# Patient Record
Sex: Male | Born: 1965 | Race: White | Hispanic: No | Smoking: Never smoker
Health system: Southern US, Community
[De-identification: ages and names within clinical notes are randomized; demographics above are authoritative.]

## PROBLEM LIST (undated history)

## (undated) ENCOUNTER — Emergency Department: Payer: Self-pay

## (undated) DIAGNOSIS — C801 Malignant (primary) neoplasm, unspecified: Secondary | ICD-10-CM

## (undated) DIAGNOSIS — Z789 Other specified health status: Secondary | ICD-10-CM

## (undated) HISTORY — PX: HERNIA REPAIR: SHX51

## (undated) HISTORY — PX: LASIK: SHX215

---

## 1998-09-27 ENCOUNTER — Emergency Department (HOSPITAL_COMMUNITY): Admission: EM | Admit: 1998-09-27 | Discharge: 1998-09-27 | Payer: Self-pay | Admitting: Emergency Medicine

## 1998-09-27 ENCOUNTER — Encounter: Payer: Self-pay | Admitting: Emergency Medicine

## 1998-09-30 ENCOUNTER — Encounter: Admission: RE | Admit: 1998-09-30 | Discharge: 1998-12-29 | Payer: Self-pay | Admitting: Internal Medicine

## 1998-10-04 ENCOUNTER — Encounter: Admission: RE | Admit: 1998-10-04 | Discharge: 1998-10-04 | Payer: Self-pay | Admitting: *Deleted

## 2006-01-10 ENCOUNTER — Emergency Department (HOSPITAL_COMMUNITY): Admission: EM | Admit: 2006-01-10 | Discharge: 2006-01-10 | Payer: Self-pay

## 2016-07-05 ENCOUNTER — Other Ambulatory Visit: Payer: Self-pay | Admitting: Otolaryngology

## 2016-07-06 ENCOUNTER — Other Ambulatory Visit: Payer: Self-pay | Admitting: Otolaryngology

## 2016-07-06 DIAGNOSIS — K118 Other diseases of salivary glands: Secondary | ICD-10-CM

## 2016-07-13 ENCOUNTER — Ambulatory Visit
Admission: RE | Admit: 2016-07-13 | Discharge: 2016-07-13 | Disposition: A | Discharge status: Home / Self Care | Payer: BLUE CROSS/BLUE SHIELD | Source: Ambulatory Visit | Attending: Otolaryngology | Admitting: Otolaryngology

## 2016-07-13 DIAGNOSIS — K118 Other diseases of salivary glands: Secondary | ICD-10-CM

## 2016-07-13 MED ORDER — IOPAMIDOL (ISOVUE-300) INJECTION 61%
75.0000 mL | Freq: Once | INTRAVENOUS | Status: AC | PRN
  Administered 2016-07-13: 75 mL via INTRAVENOUS

## 2016-08-17 ENCOUNTER — Ambulatory Visit: Payer: Self-pay | Admitting: Otolaryngology

## 2016-08-17 NOTE — H&P (Signed)
Charles Vazquez is a 50 y.o. male who presents as a new  Patient.   Referring Provider:    Chief complaint:  Cheek mass   HPI: He noticed sudden onset of a mildly tender right-sided cheek mass. Prior to that, he had a very small palpable nodule in that area for about a year. He was treated recently with antibiotics for this. Some of the surrounding pain and redness has gone down. It does seem to hurt when he eats anything. He drinks up to 40 ounces of Mountain Dew each day. He doesn't smoke or drink alcohol. Otherwise in good health.    PMH/Meds/All/SocHx/FamHx/ROS:    Past Medical History       Past Medical History:  Diagnosis Date  . Eczema        Past Surgical History        Past Surgical History:  Procedure Laterality Date  . HERNIA REPAIR        No family history of bleeding disorders, wound healing problems or difficulty with anesthesia.    Social History   Social History        Social History  . Marital status: Unknown    Spouse name: N/A  . Number of children: N/A  . Years of education: N/A   Occupational History  . Not on file.       Social History Main Topics  . Smoking status: Never Smoker  . Smokeless tobacco: Not on file  . Alcohol use No  . Drug use: Not on file  . Sexual activity: Not on file       Other Topics Concern  . Not on file   Social History Narrative       Current Outpatient Prescriptions:  .  amoxicillin-clavulanate (AUGMENTIN) 875-125 mg per tablet, TK 1 T PO BID, Disp: , Rfl: 0 .  traMADol (ULTRAM) 50 mg tablet, TK 1 T PO QID PRN, Disp: , Rfl: 0  Current Facility-Administered Medications:  .  measles, mumps and rubella vaccine (MMR) 1,000-12,500 TCID50/0.5 mL injection 0.5 mL, 0.5 mL, Subcutaneous, Once, Robin Schroeder [place], MD  A complete ROS was performed with pertinent positives/negatives noted in the HPI. The remainder of the ROS are negative.   Physical Exam:    BP 144/93  (Site: Left arm, Position: Sitting)  Ht 1.778 m (5' 10")  Wt 108.9 kg (240 lb)  BMI 34.44 kg/m2   General:  Healthy and alert, in no distress, breathing easily. Normal affect. In a pleasant mood. Head: Normocephalic, atraumatic. No masses, or scars. Eyes: Pupils are equal, and reactive to light. Vision is grossly intact. No spontaneous or gaze nystagmus. Ears: Ear canals are clear. Tympanic membranes are intact, with normal landmarks and the middle ears are clear and healthy. Hearing: Grossly normal. Nose: Nasal cavities are clear with healthy mucosa, no polyps or exudate.Airways are patent. Face: 4 cm firm slightly tender mass involving the right mid parotid gland. Facial nerve function is symmetric. Oral Cavity: No mucosal abnormalities are noted. Tongue with normal mobility. Dentition appears healthy. Oropharynx: Tonsils are symmetric. There are no mucosal masses identified. Tongue base appears normal and healthy. Larynx/Hypopharynx: deferred Chest: Deferred Neck: No palpable masses, no cervical adenopathy, no thyroid nodules or enlargement. Neuro: Cranial nerves II-XII will normal function. Balance: Normal gate. Other findings: none.   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 Recommend CT evaluation. We will   discuss further workup after that. Recommend he increase his water intake and decrease his caffeine intake. Marland Kitchen

## 2016-09-04 NOTE — Pre-Procedure Instructions (Signed)
Charles Vazquez  09/04/2016      [facility] [place], [address] [address] Phone: [phone] Fax: [phone]    Your procedure is scheduled on Friday, September 15.  Report to [facility] Admitting at 7:00 A.M.  Call this number if you have problems the morning of surgery:  [phone]   Remember:  Do not eat food or drink liquids after midnight.  Take these medicines the morning of surgery with A SIP OF WATER: NONE  7 days prior to surgery STOP taking any Aspirin, Aleve, Naproxen, Ibuprofen, Motrin, Advil, Goody's, BC's, all herbal medications, fish oil, and all vitamins    Do not wear jewelry, make-up or nail polish.  Do not wear lotions, powders, or perfumes, or deoderant.  Do not shave 48 hours prior to surgery.  Men may shave face and neck.  Do not bring valuables to the hospital.  [facility] is not responsible for any belongings or valuables.  Contacts, dentures or bridgework may not be worn into surgery.  Leave your suitcase in the car.  After surgery it may be brought to your room.  For patients admitted to the hospital, discharge time will be determined by your treatment team.  Patients discharged the day of surgery will not be allowed to drive home.    Special instructions:     [place]- Preparing For Surgery  Before surgery, you can play an important role. Because skin is not sterile, your skin needs to be as free of germs as possible. You can reduce the number of germs on your skin by washing with CHG (chlorahexidine gluconate) Soap before surgery.  CHG is an antiseptic cleaner which kills germs and bonds with the skin to continue killing germs even after washing.  Please do not use if you have an allergy to CHG or antibacterial soaps. If your skin becomes reddened/irritated stop using the CHG.  Do not shave (including legs and underarms) for at  least 48 hours prior to first CHG shower. It is OK to shave your face.  Please follow these instructions carefully.   1. Shower the NIGHT BEFORE SURGERY and the MORNING OF SURGERY with CHG.   2. If you chose to wash your hair, wash your hair first as usual with your normal shampoo.  3. After you shampoo, rinse your hair and body thoroughly to remove the shampoo.  4. Use CHG as you would any other liquid soap. You can apply CHG directly to the skin and wash gently with a scrungie or a clean washcloth.   5. Apply the CHG Soap to your body ONLY FROM THE NECK DOWN.  Do not use on open wounds or open sores. Avoid contact with your eyes, ears, mouth and genitals (private parts). Wash genitals (private parts) with your normal soap.  6. Wash thoroughly, paying special attention to the area where your surgery will be performed.  7. Thoroughly rinse your body with warm water from the neck down.  8. DO NOT shower/wash with your normal soap after using and rinsing off the CHG Soap.  9. Pat yourself dry with a CLEAN TOWEL.   10. Wear CLEAN PAJAMAS   11. Place CLEAN SHEETS on your bed the night of your first shower and DO NOT SLEEP WITH PETS.    Day of Surgery: Do not apply any deodorants/lotions. Please wear clean clothes to  the hospital/surgery center.      Please read over the following fact sheets that you were given. Pain Booklet and Surgical Site Infection Prevention

## 2016-09-05 ENCOUNTER — Encounter (HOSPITAL_COMMUNITY)
Admission: RE | Admit: 2016-09-05 | Discharge: 2016-09-05 | Disposition: A | Discharge status: Home / Self Care | Payer: BLUE CROSS/BLUE SHIELD | Source: Ambulatory Visit | Attending: Otolaryngology | Admitting: Otolaryngology

## 2016-09-05 ENCOUNTER — Encounter (HOSPITAL_COMMUNITY): Payer: Self-pay

## 2016-09-05 DIAGNOSIS — D37030 Neoplasm of uncertain behavior of the parotid salivary glands: Secondary | ICD-10-CM | POA: Diagnosis present

## 2016-09-05 DIAGNOSIS — C07 Malignant neoplasm of parotid gland: Secondary | ICD-10-CM | POA: Diagnosis not present

## 2016-09-05 LAB — CBC
HCT: 44.6 % (ref 39.0–52.0)
HEMOGLOBIN: 15.4 g/dL (ref 13.0–17.0)
MCH: 31.6 pg (ref 26.0–34.0)
MCHC: 34.5 g/dL (ref 30.0–36.0)
MCV: 91.6 fL (ref 78.0–100.0)
Platelets: 200 10*3/uL (ref 150–400)
RBC: 4.87 MIL/uL (ref 4.22–5.81)
RDW: 12.7 % (ref 11.5–15.5)
WBC: 5.8 10*3/uL (ref 4.0–10.5)

## 2016-09-05 NOTE — Progress Notes (Signed)
Denies having a PCP Denies ever seeing a cardiologist Denies ever having a card cath, stress test, or echo. Scored a 6 on apnea screen encouraged pt to establish a PCP and to tell them about the risk noted from the apnea screen, voices understanding.

## 2016-09-07 MED ORDER — CEFAZOLIN SODIUM-DEXTROSE 2-4 GM/100ML-% IV SOLN
2.0000 g | INTRAVENOUS | Status: AC
  Administered 2016-09-08: 2 g via INTRAVENOUS
  Filled 2016-09-07: qty 100

## 2016-09-08 ENCOUNTER — Ambulatory Visit (HOSPITAL_COMMUNITY): Payer: BLUE CROSS/BLUE SHIELD | Admitting: Anesthesiology

## 2016-09-08 ENCOUNTER — Encounter (HOSPITAL_COMMUNITY): Payer: Self-pay | Admitting: *Deleted

## 2016-09-08 ENCOUNTER — Encounter (HOSPITAL_COMMUNITY)
Admission: RE | Disposition: A | Discharge status: Home / Self Care | Payer: Self-pay | Source: Ambulatory Visit | Attending: Otolaryngology

## 2016-09-08 ENCOUNTER — Ambulatory Visit (HOSPITAL_COMMUNITY)
Admission: RE | Admit: 2016-09-08 | Discharge: 2016-09-08 | Disposition: A | Discharge status: Home / Self Care | Payer: BLUE CROSS/BLUE SHIELD | Source: Ambulatory Visit | Attending: Otolaryngology | Admitting: Otolaryngology

## 2016-09-08 DIAGNOSIS — C07 Malignant neoplasm of parotid gland: Secondary | ICD-10-CM | POA: Diagnosis not present

## 2016-09-08 HISTORY — PX: PAROTIDECTOMY: SHX2163

## 2016-09-08 HISTORY — DX: Other specified health status: Z78.9

## 2016-09-08 SURGERY — EXCISION, PAROTID GLAND
Anesthesia: General | Site: Neck | Laterality: Right

## 2016-09-08 MED ORDER — LACTATED RINGERS IV SOLN
INTRAVENOUS | Status: DC
  Administered 2016-09-08: 08:00:00 via INTRAVENOUS

## 2016-09-08 MED ORDER — SUGAMMADEX SODIUM 200 MG/2ML IV SOLN
INTRAVENOUS | Status: AC
  Filled 2016-09-08: qty 4

## 2016-09-08 MED ORDER — ONDANSETRON HCL 4 MG/2ML IJ SOLN
INTRAMUSCULAR | Status: AC
  Filled 2016-09-08: qty 2

## 2016-09-08 MED ORDER — SUGAMMADEX SODIUM 200 MG/2ML IV SOLN
INTRAVENOUS | Status: DC | PRN
  Administered 2016-09-08: 400 mg via INTRAVENOUS

## 2016-09-08 MED ORDER — LIDOCAINE-EPINEPHRINE 1 %-1:100000 IJ SOLN
INTRAMUSCULAR | Status: AC
  Filled 2016-09-08: qty 1

## 2016-09-08 MED ORDER — HYDROMORPHONE HCL 1 MG/ML IJ SOLN
INTRAMUSCULAR | Status: AC
  Filled 2016-09-08: qty 1

## 2016-09-08 MED ORDER — EPHEDRINE SULFATE 50 MG/ML IJ SOLN
INTRAMUSCULAR | Status: DC | PRN
  Administered 2016-09-08: 10 mg via INTRAVENOUS

## 2016-09-08 MED ORDER — SUGAMMADEX SODIUM 500 MG/5ML IV SOLN
INTRAVENOUS | Status: AC
  Filled 2016-09-08: qty 5

## 2016-09-08 MED ORDER — LIDOCAINE 2% (20 MG/ML) 5 ML SYRINGE
INTRAMUSCULAR | Status: AC
  Filled 2016-09-08: qty 5

## 2016-09-08 MED ORDER — HYDROCODONE-ACETAMINOPHEN 7.5-325 MG PO TABS: 1.0000 | ORAL_TABLET | Freq: Four times a day (QID) | ORAL | 0 refills | Status: AC | PRN

## 2016-09-08 MED ORDER — HYDROMORPHONE HCL 1 MG/ML IJ SOLN
0.2500 mg | INTRAMUSCULAR | Status: DC | PRN
Start: 2016-09-08 — End: 2016-09-08
  Administered 2016-09-08: 0.5 mg via INTRAVENOUS

## 2016-09-08 MED ORDER — PROPOFOL 10 MG/ML IV BOLUS
INTRAVENOUS | Status: DC | PRN
  Administered 2016-09-08: 150 mg via INTRAVENOUS

## 2016-09-08 MED ORDER — FENTANYL CITRATE (PF) 100 MCG/2ML IJ SOLN
INTRAMUSCULAR | Status: AC
  Filled 2016-09-08: qty 4

## 2016-09-08 MED ORDER — FENTANYL CITRATE (PF) 100 MCG/2ML IJ SOLN
INTRAMUSCULAR | Status: DC | PRN
  Administered 2016-09-08: 150 ug via INTRAVENOUS
  Administered 2016-09-08: 100 ug via INTRAVENOUS

## 2016-09-08 MED ORDER — PHENYLEPHRINE HCL 10 MG/ML IJ SOLN
INTRAMUSCULAR | Status: DC | PRN
  Administered 2016-09-08: 80 ug via INTRAVENOUS

## 2016-09-08 MED ORDER — LACTATED RINGERS IV SOLN
INTRAVENOUS | Status: DC | PRN
  Administered 2016-09-08: 08:00:00 via INTRAVENOUS

## 2016-09-08 MED ORDER — PROPOFOL 10 MG/ML IV BOLUS
INTRAVENOUS | Status: AC
  Filled 2016-09-08: qty 20

## 2016-09-08 MED ORDER — ONDANSETRON HCL 4 MG/2ML IJ SOLN
INTRAMUSCULAR | Status: DC | PRN
  Administered 2016-09-08: 4 mg via INTRAVENOUS

## 2016-09-08 MED ORDER — FENTANYL CITRATE (PF) 100 MCG/2ML IJ SOLN
INTRAMUSCULAR | Status: AC
  Filled 2016-09-08: qty 2

## 2016-09-08 MED ORDER — ROCURONIUM BROMIDE 100 MG/10ML IV SOLN
INTRAVENOUS | Status: DC | PRN
  Administered 2016-09-08: 50 mg via INTRAVENOUS

## 2016-09-08 MED ORDER — 0.9 % SODIUM CHLORIDE (POUR BTL) OPTIME
TOPICAL | Status: DC | PRN
  Administered 2016-09-08: 1000 mL

## 2016-09-08 MED ORDER — LIDOCAINE HCL (CARDIAC) 20 MG/ML IV SOLN
INTRAVENOUS | Status: DC | PRN
  Administered 2016-09-08: 60 mg via INTRAVENOUS

## 2016-09-08 MED ORDER — ROCURONIUM BROMIDE 10 MG/ML (PF) SYRINGE
PREFILLED_SYRINGE | INTRAVENOUS | Status: AC
Start: 2016-09-08 — End: 2016-09-08
  Filled 2016-09-08: qty 10

## 2016-09-08 MED ORDER — BACITRACIN ZINC 500 UNIT/GM EX OINT
TOPICAL_OINTMENT | CUTANEOUS | Status: AC
Start: 2016-09-08 — End: 2016-09-08
  Filled 2016-09-08: qty 28.35

## 2016-09-08 MED ORDER — MIDAZOLAM HCL 5 MG/5ML IJ SOLN
INTRAMUSCULAR | Status: DC | PRN
Start: 2016-09-08 — End: 2016-09-08
  Administered 2016-09-08: 2 mg via INTRAVENOUS

## 2016-09-08 MED ORDER — PROMETHAZINE HCL 25 MG RE SUPP: 25.0000 mg | Freq: Four times a day (QID) | RECTAL | 1 refills | Status: AC | PRN

## 2016-09-08 MED ORDER — LIDOCAINE-EPINEPHRINE 1 %-1:100000 IJ SOLN
INTRAMUSCULAR | Status: DC | PRN
  Administered 2016-09-08: .09 mL

## 2016-09-08 MED ORDER — CEPHALEXIN 500 MG PO CAPS: 500.0000 mg | ORAL_CAPSULE | Freq: Three times a day (TID) | ORAL | 0 refills | Status: AC

## 2016-09-08 MED ORDER — MIDAZOLAM HCL 2 MG/2ML IJ SOLN
INTRAMUSCULAR | Status: AC
  Filled 2016-09-08: qty 2

## 2016-09-08 SURGICAL SUPPLY — 41 items
ADH SKN CLS APL DERMABOND .7 (GAUZE/BANDAGES/DRESSINGS) ×1
BLADE SURG 15 STRL LF DISP TIS (BLADE) ×1 IMPLANT
BLADE SURG 15 STRL SS (BLADE) ×3
CANISTER SUCTION 2500CC (MISCELLANEOUS) ×3 IMPLANT
CLEANER TIP ELECTROSURG 2X2 (MISCELLANEOUS) ×3 IMPLANT
CONT SPEC 4OZ CLIKSEAL STRL BL (MISCELLANEOUS) ×2 IMPLANT
CORDS BIPOLAR (ELECTRODE) ×3 IMPLANT
COVER SURGICAL LIGHT HANDLE (MISCELLANEOUS) ×3 IMPLANT
DERMABOND ADVANCED (GAUZE/BANDAGES/DRESSINGS) ×2
DERMABOND ADVANCED .7 DNX12 (GAUZE/BANDAGES/DRESSINGS) ×1 IMPLANT
DRAIN PENROSE 1/4X12 LTX STRL (WOUND CARE) ×2 IMPLANT
DRAPE INCISE 23X17 IOBAN STRL (DRAPES) ×2
DRAPE INCISE 23X17 STRL (DRAPES) ×1 IMPLANT
DRAPE INCISE IOBAN 23X17 STRL (DRAPES) ×1 IMPLANT
DRAPE PROXIMA HALF (DRAPES) ×2 IMPLANT
ELECT COATED BLADE 2.86 ST (ELECTRODE) ×3 IMPLANT
ELECT REM PT RETURN 9FT ADLT (ELECTROSURGICAL) ×3
ELECTRODE REM PT RTRN 9FT ADLT (ELECTROSURGICAL) ×1 IMPLANT
FORCEPS BIPOLAR SPETZLER 8 1.0 (NEUROSURGERY SUPPLIES) ×3 IMPLANT
GAUZE SPONGE 4X4 12PLY STRL (GAUZE/BANDAGES/DRESSINGS) ×2 IMPLANT
GAUZE SPONGE 4X4 16PLY XRAY LF (GAUZE/BANDAGES/DRESSINGS) ×3 IMPLANT
GLOVE BIO SURGEON STRL SZ 6.5 (GLOVE) ×1 IMPLANT
GLOVE BIO SURGEONS STRL SZ 6.5 (GLOVE) ×1
GLOVE ECLIPSE 7.5 STRL STRAW (GLOVE) ×3 IMPLANT
GOWN STRL REUS W/ TWL LRG LVL3 (GOWN DISPOSABLE) ×2 IMPLANT
GOWN STRL REUS W/TWL LRG LVL3 (GOWN DISPOSABLE) ×6
KIT BASIN OR (CUSTOM PROCEDURE TRAY) ×3 IMPLANT
KIT ROOM TURNOVER OR (KITS) ×3 IMPLANT
NEEDLE 27GAX1X1/2 (NEEDLE) ×3 IMPLANT
NS IRRIG 1000ML POUR BTL (IV SOLUTION) ×3 IMPLANT
PAD ARMBOARD 7.5X6 YLW CONV (MISCELLANEOUS) ×6 IMPLANT
PENCIL FOOT CONTROL (ELECTRODE) ×3 IMPLANT
SHEARS HARMONIC 9CM CVD (BLADE) ×3 IMPLANT
SOL PREP POV-IOD 4OZ 10% (MISCELLANEOUS) ×2 IMPLANT
STAPLER VISISTAT 35W (STAPLE) ×3 IMPLANT
SUT CHROMIC 3 0 SH 27 (SUTURE) ×2 IMPLANT
SUT CHROMIC 4 0 PS 2 18 (SUTURE) ×3 IMPLANT
SUT SILK 2 0 SH CR/8 (SUTURE) ×3 IMPLANT
SUT SILK 4 0 REEL (SUTURE) ×3 IMPLANT
TOWEL OR 17X24 6PK STRL BLUE (TOWEL DISPOSABLE) ×3 IMPLANT
TRAY ENT MC OR (CUSTOM PROCEDURE TRAY) ×3 IMPLANT

## 2016-09-08 NOTE — Anesthesia Preprocedure Evaluation (Addendum)
Anesthesia Evaluation  Patient identified by MRN, date of birth, ID band Patient awake    Reviewed: Allergy & Precautions, H&P , NPO status , Patient's Chart, lab work & pertinent test results  Airway Mallampati: II  TM Distance: >3 FB Neck ROM: Full    Dental no notable dental hx. (+) Teeth Intact, Dental Advisory Given   Pulmonary neg pulmonary ROS,    Pulmonary exam normal breath sounds clear to auscultation       Cardiovascular negative cardio ROS   Rhythm:Regular Rate:Normal     Neuro/Psych negative neurological ROS  negative psych ROS   GI/Hepatic negative GI ROS, Neg liver ROS,   Endo/Other  negative endocrine ROS  Renal/GU negative Renal ROS  negative genitourinary   Musculoskeletal   Abdominal   Peds  Hematology negative hematology ROS (+)   Anesthesia Other Findings   Reproductive/Obstetrics negative OB ROS                            Anesthesia Physical Anesthesia Plan  ASA: II  Anesthesia Plan: General   Post-op Pain Management:    Induction: Intravenous  Airway Management Planned: Oral ETT  Additional Equipment:   Intra-op Plan:   Post-operative Plan: Extubation in OR  Informed Consent: I have reviewed the patients History and Physical, chart, labs and discussed the procedure including the risks, benefits and alternatives for the proposed anesthesia with the patient or authorized representative who has indicated his/her understanding and acceptance.   Dental advisory given  Plan Discussed with: CRNA  Anesthesia Plan Comments:         Anesthesia Quick Evaluation

## 2016-09-08 NOTE — H&P (View-Only) (Signed)
Charles Vazquez is a 50 y.o. male who presents as a new  Patient.   Referring Provider:    Chief complaint:  Cheek mass   HPI: He noticed sudden onset of a mildly tender right-sided cheek mass. Prior to that, he had a very small palpable nodule in that area for about a year. He was treated recently with antibiotics for this. Some of the surrounding pain and redness has gone down. It does seem to hurt when he eats anything. He drinks up to 40 ounces of [facility] each day. He doesn't smoke or drink alcohol. Otherwise in good health.    PMH/Meds/All/SocHx/FamHx/ROS:    Past Medical History       Past Medical History:  Diagnosis Date  . Eczema        Past Surgical History        Past Surgical History:  Procedure Laterality Date  . HERNIA REPAIR        No family history of bleeding disorders, wound healing problems or difficulty with anesthesia.    Social History   Social History        Social History  . Marital status: Unknown    Spouse name: N/A  . Number of children: N/A  . Years of education: N/A   Occupational History  . Not on file.       Social History Main Topics  . Smoking status: Never Smoker  . Smokeless tobacco: Not on file  . Alcohol use No  . Drug use: Not on file  . Sexual activity: Not on file       Other Topics Concern  . Not on file   Social History Narrative       Current Outpatient Prescriptions:  .  amoxicillin-clavulanate (AUGMENTIN) 875-125 mg per tablet, TK 1 T PO BID, Disp: , Rfl: 0 .  traMADol (ULTRAM) 50 mg tablet, TK 1 T PO QID PRN, Disp: , Rfl: 0  Current Facility-Administered Medications:  .  measles, mumps and rubella vaccine (MMR) 1,000-12,500 TCID50/0.5 mL injection 0.5 mL, 0.5 mL, Subcutaneous, Once, [name], MD  A complete ROS was performed with pertinent positives/negatives noted in the HPI. The remainder of the ROS are negative.   Physical Exam:    BP 144/93  (Site: Left arm, Position: Sitting)  Ht 1.778 m (_0 )  Wt 108.9 kg (240 lb)  BMI 34.44 kg/m2   General:  Healthy and alert, in no distress, breathing easily. Normal affect. In a pleasant mood. Head: Normocephalic, atraumatic. No masses, or scars. Eyes: Pupils are equal, and reactive to light. Vision is grossly intact. No spontaneous or gaze nystagmus. Ears: Ear canals are clear. Tympanic membranes are intact, with normal landmarks and the middle ears are clear and healthy. Hearing: Grossly normal. Nose: Nasal cavities are clear with healthy mucosa, no polyps or exudate.Airways are patent. Face: 4 cm firm slightly tender mass involving the right mid parotid gland. Facial nerve function is symmetric. Oral Cavity: No mucosal abnormalities are noted. Tongue with normal mobility. Dentition appears healthy. Oropharynx: Tonsils are symmetric. There are no mucosal masses identified. Tongue base appears normal and healthy. Larynx/Hypopharynx: deferred Chest: Deferred Neck: No palpable masses, no cervical adenopathy, no thyroid nodules or enlargement. Neuro: Cranial nerves II-XII will normal function. Balance: Normal gate. Other findings: none.   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 Recommend CT evaluation. We will  discuss further workup after that. Recommend he increase his water intake and decrease his caffeine intake. Marland Kitchen

## 2016-09-08 NOTE — Progress Notes (Signed)
Lunch relief by MA Erica Richwine RN 

## 2016-09-08 NOTE — Op Note (Signed)
OPERATIVE REPORT  DATE OF SURGERY: 09/08/2016  PATIENT:  Charles Vazquez,  50 y.o. male  PRE-OPERATIVE DIAGNOSIS:  Right parotid mass  POST-OPERATIVE DIAGNOSIS:  Right parotid mass  PROCEDURE:  Procedure(s): Excision of periparotid lymph node  SURGEON:  Beckie Salts, MD  ASSISTANTS: Jolene Provost PA  ANESTHESIA:   General   EBL:  5 ml  DRAINS: 1/4 inch Penrose  LOCAL MEDICATIONS USED:  None  SPECIMEN:  Right periparotid lymph node, frozen section analysis reveals no evidence of carcinoma, atypical cells, possible lymphoma.  COUNTS:  Correct  PROCEDURE DETAILS: The patient was taken to the operating room and placed on the operating table in the supine position. Following induction of general endotracheal anesthesia, the right face was prepped and draped in a standard fashion. A preauricular incision was outlined with a marking pen with extension around the lobule to the mastoid. Electrocautery was used to incise the skin and subcutaneous tissue. The skin flap was being developed anteriorly and the mass was noted to be just superficial to the parotid fascia. Decision was made at this point to not perform a formal facial nerve dissection but instead to remove the lesion for frozen section analysis to make sure that it was not obvious carcinoma. As was done using blunt dissection and the harmonic dissector being watchful for any facial nerve stimulation and there was none. The lesion was removed and sent for frozen section analysis. The wound was closed using running 3-0 chromic in a subcuticular fashion. The Penrose drain was left exiting through the inferior aspect of the incision. A dressing was applied. The patient was awakened extubated and transferred to recovery in stable condition.    PATIENT DISPOSITION:  To PACU, stable

## 2016-09-08 NOTE — Transfer of Care (Signed)
Immediate Anesthesia Transfer of Care Note  Patient: Charles Vazquez  Procedure(s) Performed: Procedure(s) with comments: PAROTIDECTOMY (Right) - with facial nerve dissection  Patient Location: PACU  Anesthesia Type:General  Level of Consciousness: awake, alert , oriented and patient cooperative  Airway & Oxygen Therapy: Patient Spontanous Breathing  Post-op Assessment: Report given to RN, Post -op Vital signs reviewed and stable and Patient moving all extremities  Post vital signs: Reviewed and stable  Last Vitals:  Vitals:   09/08/16 0753 09/08/16 1026  BP: (!) 159/92 120/87  Pulse: 70 80  Resp: 18 (!) 8  Temp: 37.1 C 36.6 C    Last Pain:  Vitals:   09/08/16 0753  TempSrc: Oral      Patients Stated Pain Goal: 0 ([id number])  Complications: No apparent anesthesia complications

## 2016-09-08 NOTE — Anesthesia Postprocedure Evaluation (Signed)
Anesthesia Post Note  Patient: Charles Vazquez  Procedure(s) Performed: Procedure(s) (LRB): PAROTIDECTOMY (Right)  Patient location during evaluation: PACU Anesthesia Type: General Level of consciousness: awake and alert Pain management: pain level controlled Vital Signs Assessment: post-procedure vital signs reviewed and stable Respiratory status: spontaneous breathing, nonlabored ventilation and respiratory function stable Cardiovascular status: blood pressure returned to baseline and stable Postop Assessment: no signs of nausea or vomiting Anesthetic complications: no    Last Vitals:  Vitals:   09/08/16 1026 09/08/16 1124  BP: 120/87   Pulse: 80 83  Resp: (!) 8 15  Temp: 36.6 C 36.1 C    Last Pain:  Vitals:   09/08/16 1124  TempSrc:   PainSc: 2                  Aranda Bihm,W. EDMOND

## 2016-09-08 NOTE — Discharge Instructions (Signed)
On Saturday morning, remove the dressing. There is a small yellow rubber drain, pull that out. It should pull out very easily. If it happens to fall out prior to that that, just discard.  Incision is closed with surgical glue on the surface. There is no care necessary for this except do not use any creams, oils or ointment on it. Starting Sunday night it is okay to use soap and water on your face. There may be some drainage from the lower part of the incision where the drain was, and if so you can put a dressing or bandage on there just to keep your clothing from getting stained.  Call our office for a follow-up appointment for next week, at which time we should have the results of the biopsy.

## 2016-09-08 NOTE — Interval H&P Note (Signed)
History and Physical Interval Note:  09/08/2016 7:19 AM  Charles Vazquez  has presented today for surgery, with the diagnosis of Right parotid mass  The various methods of treatment have been discussed with the patient and family. After consideration of risks, benefits and other options for treatment, the patient has consented to  Procedure(s) with comments: PAROTIDECTOMY (Right) - with facial nerve dissection as a surgical intervention .  The patient's history has been reviewed, patient examined, no change in status, stable for surgery.  I have reviewed the patient's chart and labs.  Questions were answered to the patient's satisfaction.     Fritz Cauthon

## 2016-09-08 NOTE — Anesthesia Procedure Notes (Signed)
Procedure Name: Intubation Date/Time: 09/08/2016 9:07 AM Performed by: Rebekah Chesterfield L Pre-anesthesia Checklist: Patient identified, Emergency Drugs available, Suction available and Patient being monitored Patient Re-evaluated:Patient Re-evaluated prior to inductionOxygen Delivery Method: Circle System Utilized Preoxygenation: Pre-oxygenation with 100% oxygen Intubation Type: IV induction Ventilation: Mask ventilation without difficulty and Oral airway inserted - appropriate to patient size Laryngoscope Size: Mac and 4 Grade View: Grade I Tube type: Oral Tube size: 8.0 mm Number of attempts: 1 Airway Equipment and Method: Stylet and Oral airway Placement Confirmation: ETT inserted through vocal cords under direct vision,  positive ETCO2 and breath sounds checked- equal and bilateral Secured at: 21 cm Tube secured with: Tape Dental Injury: Teeth and Oropharynx as per pre-operative assessment

## 2016-09-11 ENCOUNTER — Encounter (HOSPITAL_COMMUNITY): Payer: Self-pay | Admitting: Otolaryngology

## 2016-10-12 ENCOUNTER — Encounter (HOSPITAL_COMMUNITY): Payer: Self-pay

## 2016-11-14 ENCOUNTER — Encounter (HOSPITAL_COMMUNITY): Payer: Self-pay

## 2016-11-14 DIAGNOSIS — R590 Localized enlarged lymph nodes: Secondary | ICD-10-CM | POA: Insufficient documentation

## 2016-11-14 DIAGNOSIS — R220 Localized swelling, mass and lump, head: Secondary | ICD-10-CM | POA: Diagnosis present

## 2016-11-14 LAB — CBC WITH DIFFERENTIAL/PLATELET
BASOS ABS: 0 10*3/uL (ref 0.0–0.1)
BASOS PCT: 0 %
EOS ABS: 0.4 10*3/uL (ref 0.0–0.7)
EOS PCT: 4 %
HCT: 43.4 % (ref 39.0–52.0)
HEMOGLOBIN: 15.1 g/dL (ref 13.0–17.0)
Lymphocytes Relative: 16 %
Lymphs Abs: 1.4 10*3/uL (ref 0.7–4.0)
MCH: 31.7 pg (ref 26.0–34.0)
MCHC: 34.8 g/dL (ref 30.0–36.0)
MCV: 91 fL (ref 78.0–100.0)
Monocytes Absolute: 0.9 10*3/uL (ref 0.1–1.0)
Monocytes Relative: 10 %
NEUTROS PCT: 70 %
Neutro Abs: 6.3 10*3/uL (ref 1.7–7.7)
PLATELETS: 219 10*3/uL (ref 150–400)
RBC: 4.77 MIL/uL (ref 4.22–5.81)
RDW: 12.4 % (ref 11.5–15.5)
WBC: 9 10*3/uL (ref 4.0–10.5)

## 2016-11-14 LAB — BASIC METABOLIC PANEL
ANION GAP: 8 (ref 5–15)
BUN: 12 mg/dL (ref 6–20)
CHLORIDE: 101 mmol/L (ref 101–111)
CO2: 27 mmol/L (ref 22–32)
Calcium: 9.1 mg/dL (ref 8.9–10.3)
Creatinine, Ser: 1.16 mg/dL (ref 0.61–1.24)
Glucose, Bld: 141 mg/dL — ABNORMAL HIGH (ref 65–99)
POTASSIUM: 4.1 mmol/L (ref 3.5–5.1)
SODIUM: 136 mmol/L (ref 135–145)

## 2016-11-14 NOTE — ED Triage Notes (Signed)
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 Pt speaking in full and complete sentences; No acute distress noted at triage;

## 2016-11-15 ENCOUNTER — Emergency Department (HOSPITAL_COMMUNITY)
Admission: EM | Admit: 2016-11-15 | Discharge: 2016-11-15 | Disposition: A | Discharge status: Home / Self Care | Payer: BLUE CROSS/BLUE SHIELD | Attending: Emergency Medicine | Admitting: Emergency Medicine

## 2016-11-15 ENCOUNTER — Emergency Department (HOSPITAL_COMMUNITY): Payer: BLUE CROSS/BLUE SHIELD

## 2016-11-15 ENCOUNTER — Encounter (HOSPITAL_COMMUNITY): Payer: Self-pay | Admitting: *Deleted

## 2016-11-15 DIAGNOSIS — R591 Generalized enlarged lymph nodes: Secondary | ICD-10-CM

## 2016-11-15 HISTORY — DX: Malignant (primary) neoplasm, unspecified: C80.1

## 2016-11-15 MED ORDER — KETOROLAC TROMETHAMINE 30 MG/ML IJ SOLN: 30.0000 mg | Freq: Once | INTRAMUSCULAR | Status: DC

## 2016-11-15 MED ORDER — IOPAMIDOL (ISOVUE-300) INJECTION 61%
INTRAVENOUS | Status: AC
  Administered 2016-11-15: 75 mL
  Filled 2016-11-15: qty 75

## 2016-11-15 NOTE — ED Notes (Signed)
PA at bedside.

## 2016-11-15 NOTE — Discharge Instructions (Signed)
Your CT findings are concerning for lymphoma; however, there does not appear to be any changes to prevent you from breathing or swallowing well. We recommend that you call Dr. Nicolette Bang in the morning to discuss your visit to the emergency department tonight. Try and schedule follow-up with Dr. Nicolette Bang as soon as possible for recheck. You may benefit from taking ibuprofen or Aleve as these are anti-inflammatories and may help improve your swelling. Return for increased trouble breathing or swallowing, drooling, wheezing, or fever.

## 2016-11-15 NOTE — ED Provider Notes (Signed)
[facility] DEPT Provider Note   CSN: [id number] Arrival date & time: 11/14/16  2044    History   Chief Complaint Chief Complaint  Patient presents with  . Mass    throat     HPI Charles Vazquez is a 50 y.o. male.  The patient is a 50 year old male with a history of right parotid malignancy. He is scheduled to have removal of on 12/01/2016. Patient presents for worsening swelling under his left jaw. He states that swelling has not changed significantly in the past 6 hours. Swelling was initially rapid. Patient states that the area is slightly tender. He has also noted some mild changes with swallowing and breathing, though he denies shortness of breath or inability to swallow. No wheezing or recent fevers. No sore throat. Patient followed by Dr. Nicolette Bang (otolaryngology) and Dr. Rudean Hitt (oncology), both of [facility].  Per note by Dr. 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 Malignancy is of unclassifiable lineage on pathology. He has pathologic lymphadenopathy in both necks but otherwise PET scan is unrevealing.      The history is provided by the patient. No language interpreter was used.    Past Medical History:  Diagnosis Date  . Cancer ([place])   . Medical history non-contributory     There are no active problems to display for this patient.   Past Surgical History:  Procedure Laterality Date  . HERNIA REPAIR    . LASIK    . PAROTIDECTOMY Right 09/08/2016   Procedure: PAROTIDECTOMY;  Surgeon: Izora Gala, MD;  Location: [facility] OR;  Service: ENT;  Laterality: Right;  with facial nerve dissection      Home Medications    Prior to Admission medications   Medication Sig Start Date End Date Taking? Authorizing Provider  cephALEXin (KEFLEX) 500 MG capsule Take 1 capsule (500 mg total) by mouth 3 (three) times daily. Patient not taking: Reported on 11/15/2016 09/08/16   Izora Gala, MD  HYDROcodone-acetaminophen (NORCO)  7.5-325 MG tablet Take 1 tablet by mouth every 6 (six) hours as needed for moderate pain. Patient not taking: Reported on 11/15/2016 09/08/16   Izora Gala, MD  promethazine (PHENERGAN) 25 MG suppository Place 1 suppository (25 mg total) rectally every 6 (six) hours as needed for nausea or vomiting. Patient not taking: Reported on 11/15/2016 09/08/16   Izora Gala, MD    Family History No family history on file.  Social History Social History  Substance Use Topics  . Smoking status: Never Smoker  . Smokeless tobacco: Never Used  . Alcohol use Yes     Comment: rare     Allergies   No known allergies   Review of Systems Review of Systems Ten systems reviewed and are negative for acute change, except as noted in the HPI.    Physical Exam Updated Vital Signs BP 142/95   Pulse 72   Temp 98.7 F (37.1 C) (Oral)   Resp 18   Ht 5\' 10"  (1.778 m)   Wt 111.1 kg   SpO2 99%   BMI 35.15 kg/m   Physical Exam  Constitutional: He is oriented to person, place, and time. He appears well-developed and well-nourished. No distress.  Nontoxic appearing, in no distress  HENT:  Head: Normocephalic and atraumatic.  Oropharynx clear. Uvula midline. Patient tolerating secretions without difficulty. No tripoding.  Eyes: Conjunctivae and EOM are normal. No scleral icterus.  Neck: Normal range of motion.  There is swelling to the left neck. I  appreciated this to likely be tonsillar lymphadenopathy. No meningismus.  Cardiovascular: Normal rate, regular rhythm and intact distal pulses.   Pulmonary/Chest: Effort normal. No respiratory distress. He has no wheezes. He has no rales.  Lungs CTAB. No stridor.  Musculoskeletal: Normal range of motion.  Neurological: He is alert and oriented to person, place, and time. He exhibits normal muscle tone. Coordination normal.  Skin: Skin is warm and dry. No rash noted. He is not diaphoretic. No erythema. No pallor.  Psychiatric: He has a normal mood and  affect. His behavior is normal.  Nursing note and vitals reviewed.    ED Treatments / Results  Labs (all labs ordered are listed, but only abnormal results are displayed) Labs Reviewed  BASIC METABOLIC PANEL - Abnormal; Notable for the following:       Result Value   Glucose, Bld 141 (*)    All other components within normal limits  CBC WITH DIFFERENTIAL/PLATELET    EKG  EKG Interpretation None       Radiology Ct Soft Tissue Neck W Contrast  Result Date: 11/15/2016 CLINICAL DATA:  50 y/o M; neck swelling and pain. History of right parotid biopsy 09/08/2016. EXAM: CT NECK WITH CONTRAST TECHNIQUE: Multidetector CT imaging of the neck was performed using the standard protocol following the bolus administration of intravenous contrast. CONTRAST:  40mL ISOVUE-300 IOPAMIDOL (ISOVUE-300) INJECTION 61% COMPARISON:  None. FINDINGS: Pharynx and larynx: Normal. No mass or swelling. Salivary glands: Right parotid mass measures 13 x 14 mm decreased in size from prior study. Thyroid: Normal. Lymph nodes: Level 2 cervical lymph nodes are markedly enlarged from prior MR measuring 50 x 31 mm on the left an 28 x 21 mm on the right (AP by ML) (series 3 image 38 and 44). The left-sided lymphadenopathy has surrounding fat stranding and is continuous with the left sternocleidomastoid muscle likely representing extranodal extension. Additionally, left-sided parotid tail lymph node and lymph nodes in the level 3 and 5 stations while sub cm are increased in prominence. Vascular: Negative. Limited intracranial: Negative. Visualized orbits: Negative. Mastoids and visualized paranasal sinuses: Clear. Skeleton: No acute or aggressive process. Upper chest: Negative. Other: None. IMPRESSION: 1. Markedly interval enlargement of level 2 cervical lymphadenopathy, left greater than right, with findings suggesting extranodal extension of the left-sided nodes. This is probably related to metastatic disease or lymphoma.  Infectious or inflammatory etiologies considered less likely. Additional lymph nodes throughout the neck while sub cm are more prominent than on the prior CT. 2. Decrease in size of right parotid mass may represent residual or recurrent neoplasm. Electronically Signed   By: Kristine Garbe M.D.   On: 11/15/2016 04:56    Procedures Procedures (including critical care time)  Medications Ordered in ED Medications  iopamidol (ISOVUE-300) 61 % injection (75 mLs  Contrast Given 11/15/16 0329)     Initial Impression / Assessment and Plan / ED Course  I have reviewed the triage vital signs and the nursing notes.  Pertinent labs & imaging results that were available during my care of the patient were reviewed by me and considered in my medical decision making (see chart for details).  Clinical Course     5:14 AM Imaging discussed with Dr. Toney Reil who was reviewed the images again during our phone discussion. Dr. Toney Reil reports that the airway is patent on CT; no obstructive concerns with regard to his airway. Dr. Toney Reil states that the lymph nodes will usually look necrotic at this size if the underlying cause is  infectious. This makes lymphoma more likely.  5:24 AM Findings reviewed with the patient at bedside. Patient verbalizes understanding. He has not had any worsening swelling while in the emergency department. No changes inability to breathe or swallow. Vitals have remained stable. No hypoxia. Patient tolerating secretions without difficulty. No tripoding. Given reassuring imaging and stability over ED stay, I do not believe admission is indicated. The patient has been instructed to follow-up with Dr. Nicolette Bang at [facility] as soon as possible to discuss outpatient repeat evaluation. The patient has been advised to take ibuprofen or Aleve as this may help improve swelling. The patient has been instructed to return for any new or concerning symptoms. Return precautions discussed and  provided. Patient discharged in stable condition with no unaddressed concerns.   Final Clinical Impressions(s) / ED Diagnoses   Final diagnoses:  Lymphadenopathy    New Prescriptions New Prescriptions   No medications on file     Antonietta Breach, PA-C 11/15/16 [id number]    Ezequiel Essex, MD 11/15/16 [phone]

## 2018-03-07 IMAGING — CT CT NECK W/ CM
4 of 5 series · 15 of 33 positions shown, 17 images · IV contrast (Omni 300)
Comparison: None.

CLINICAL DATA: 50 y/o M; neck swelling and pain. History of right
parotid biopsy 09/08/2016.

EXAM:
CT NECK WITH CONTRAST
TECHNIQUE: Multidetector CT imaging of the neck was performed using the
standard protocol following the bolus administration of intravenous
contrast.
CONTRAST:  75mL GKYX4G-ULL IOPAMIDOL (GKYX4G-ULL) INJECTION 61%

[Series 3: neck 2.0 st · axial · 0.42mm/px · z∈[-62,+94]mm · 4 of 132 slices shown, 5 images (1 of 3)]
[im 27/132  soft-tissue]
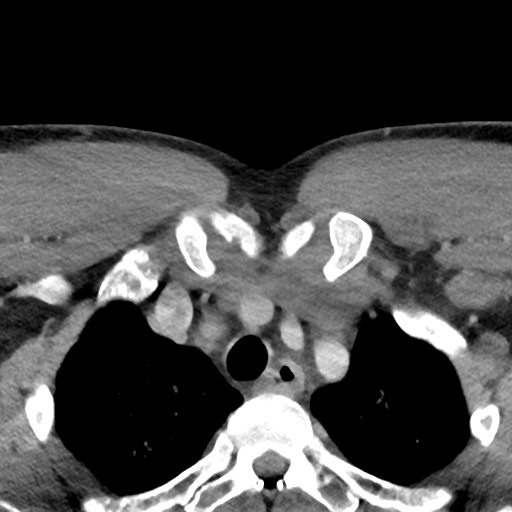
[im 27/132  bone]
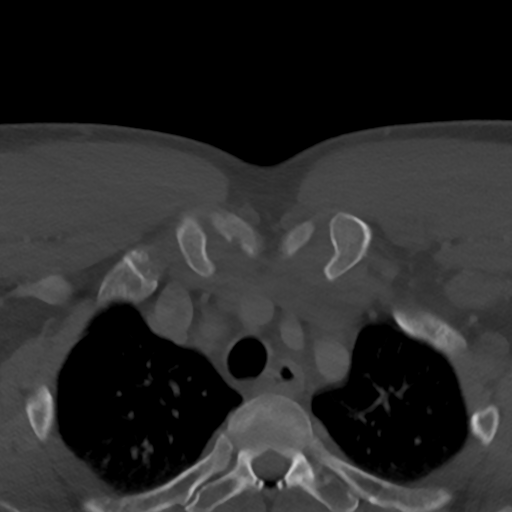
[im 53/132  bone]
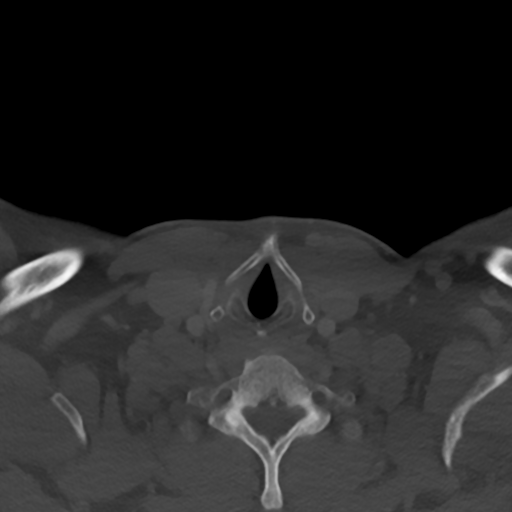
[im 79/132  bone]
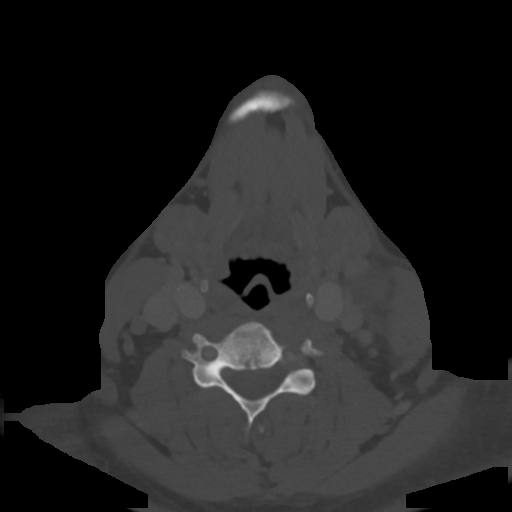
[im 105/132  bone]
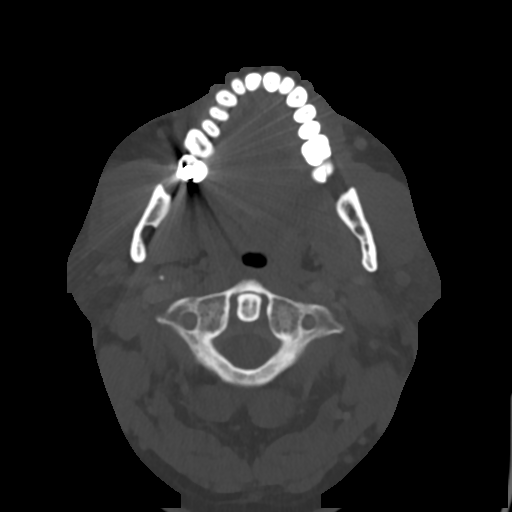

[Series 5: neck 2.0 st · sagittal · 0.47mm/px · 5 of 81 slices shown, 6 images (2 of 3)]
[im 27/81  bone]
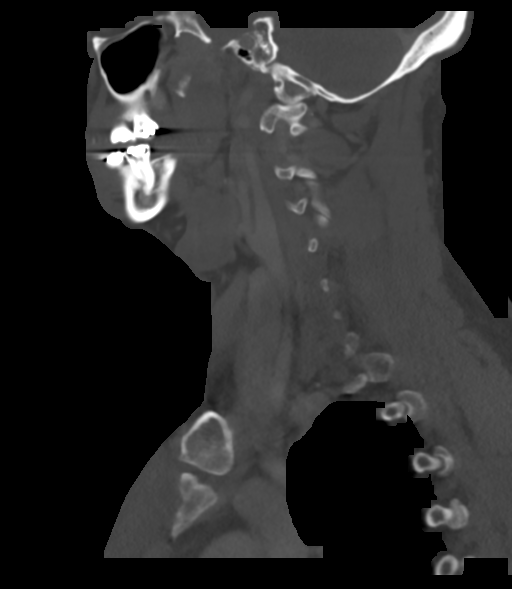
[im 34/81  bone]
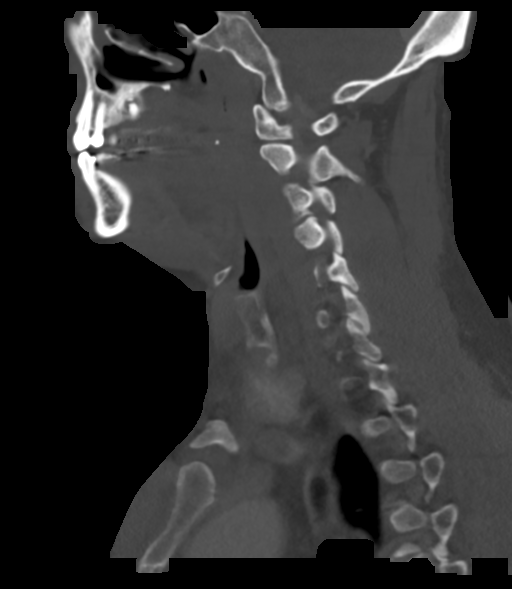
[im 41/81  soft-tissue]
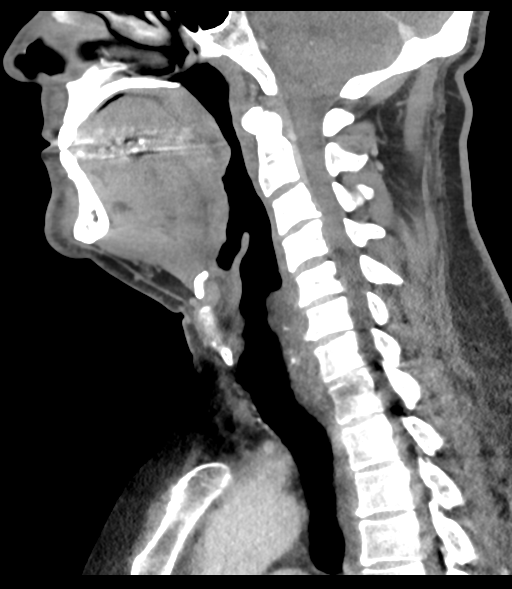
[im 41/81  bone]
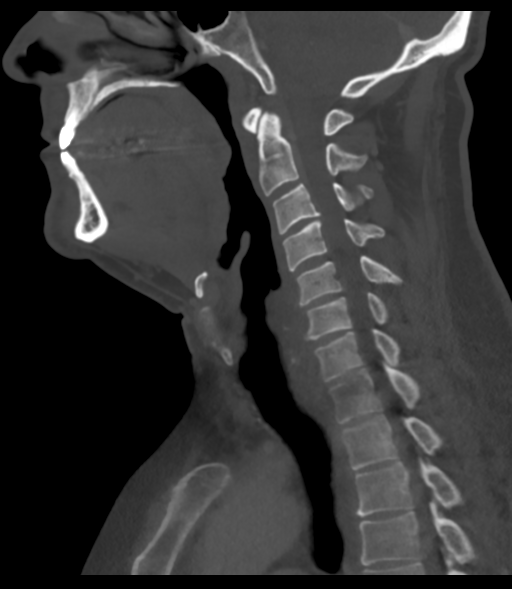
[im 47/81  bone]
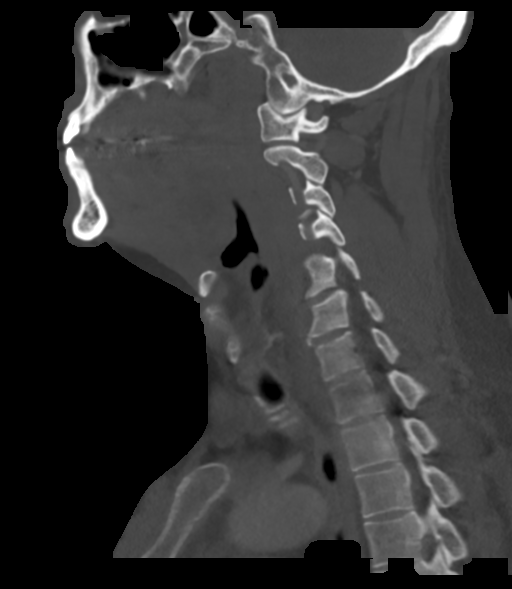
[im 54/81  bone]
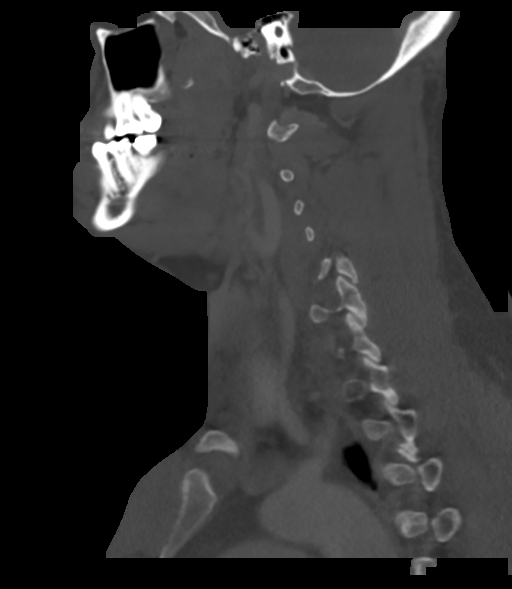

[Series 6: neck 2.0 st · coronal · 0.47mm/px · 3 of 106 slices shown (3 of 3)]
[im 23/106  bone]
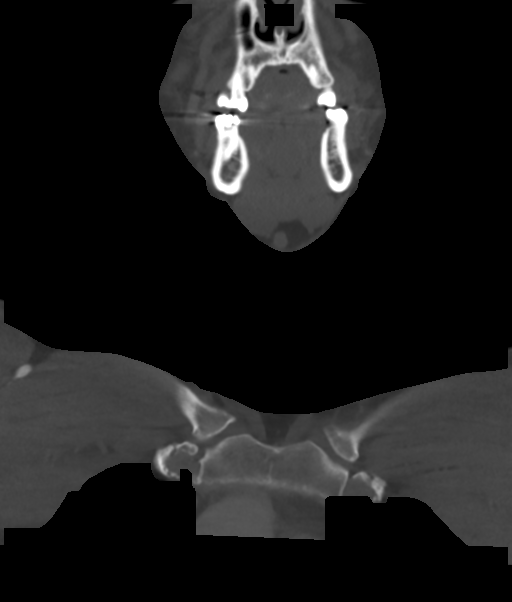
[im 43/106  bone]
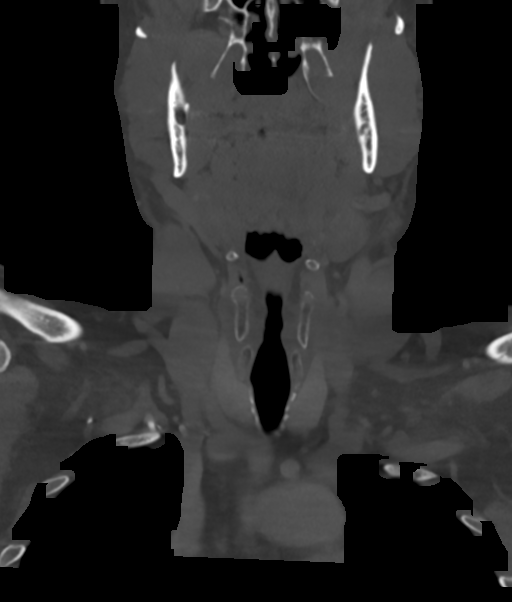
[im 63/106  bone]
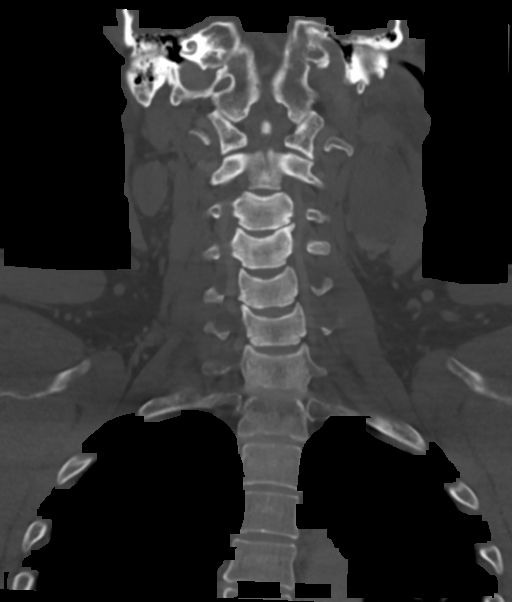

[Series 7: neck 2.0 st orthogonal · axial · 0.39mm/px · z∈[-116,-8]mm · 3 of 152 slices shown]
[im 31/152  bone]
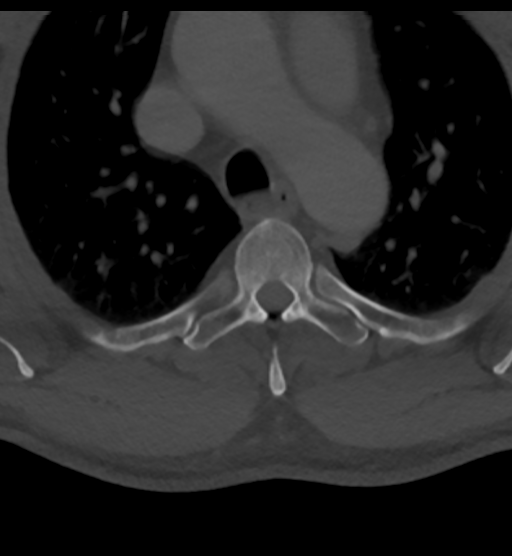
[im 61/152  bone]
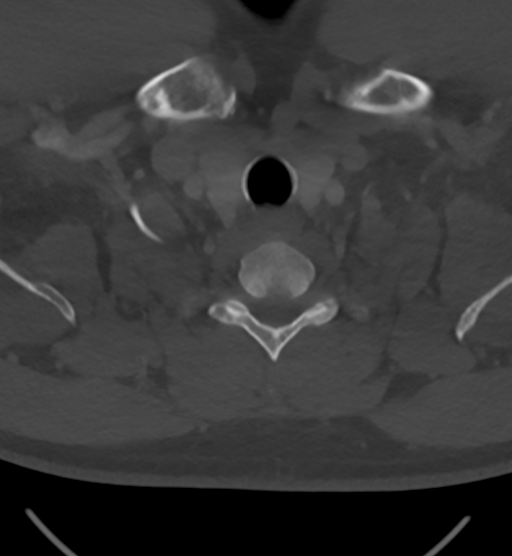
[im 91/152  bone]
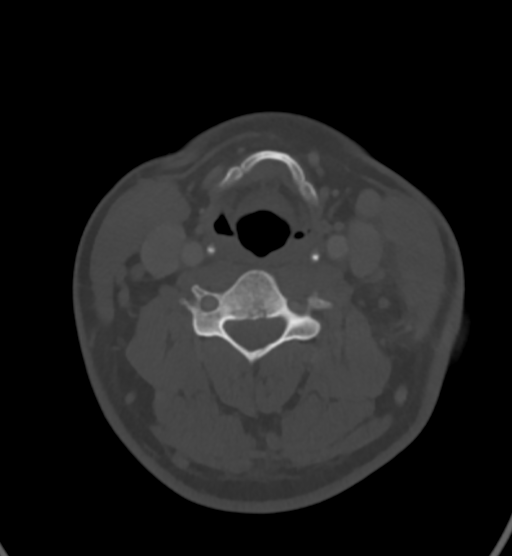

[15 of 33 positions shown; findings below may reference images not displayed]

FINDINGS: Pharynx and larynx: Normal. No mass or swelling.

Salivary glands: Right parotid mass measures 13 x 14 mm decreased in
size from prior study.

Thyroid: Normal.

Lymph nodes: Level 2 cervical lymph nodes are markedly enlarged from
prior MR measuring 50 x 31 mm on the left an 28 x 21 mm on the right
(AP by ML) (series 3 image 38 and 44). The left-sided
lymphadenopathy has surrounding fat stranding and is continuous with
the left sternocleidomastoid muscle likely representing extranodal
extension.

Additionally, left-sided parotid tail lymph node and lymph nodes in
the level 3 and 5 stations while sub cm are increased in prominence.

Vascular: Negative.

Limited intracranial: Negative.

Visualized orbits: Negative.

Mastoids and visualized paranasal sinuses: Clear.

Skeleton: No acute or aggressive process.

Upper chest: Negative.

Other: None.
IMPRESSION: 1. Markedly interval enlargement of level 2 cervical
lymphadenopathy, left greater than right, with findings suggesting
extranodal extension of the left-sided nodes. This is probably
related to metastatic disease or lymphoma. Infectious or
inflammatory etiologies considered less likely. Additional lymph
nodes throughout the neck while sub cm are more prominent than on
the prior CT.
2. Decrease in size of right parotid mass may represent residual or
recurrent neoplasm.

By: Dieny Gerges M.D.
